# Patient Record
Sex: Male | Born: 1990 | Race: White | Hispanic: No | Marital: Single | Smoking: Never smoker
Health system: Southern US, Community
[De-identification: ages and names within clinical notes are randomized; demographics above are authoritative.]

## PROBLEM LIST (undated history)

## (undated) HISTORY — PX: WISDOM TOOTH EXTRACTION: SHX21

---
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
## 2013-10-18 ENCOUNTER — Ambulatory Visit (INDEPENDENT_AMBULATORY_CARE_PROVIDER_SITE_OTHER): Payer: BC Managed Care – PPO | Admitting: Surgery

## 2013-10-18 ENCOUNTER — Encounter (INDEPENDENT_AMBULATORY_CARE_PROVIDER_SITE_OTHER): Payer: Self-pay | Admitting: Surgery

## 2013-10-18 VITALS — BP 116/78 | HR 80 | Temp 97.0°F | Ht 76.0 in | Wt 259.0 lb

## 2013-10-18 DIAGNOSIS — L989 Disorder of the skin and subcutaneous tissue, unspecified: Secondary | ICD-10-CM

## 2013-10-18 NOTE — Progress Notes (Signed)
   Re:   Karl Martin DOB:   1990/10/10 MRN:   [id number]  ASSESSMENT AND PLAN: 1.  Sebaceous cyst of right medial calf, 2.0 cm, and right upper arm, 1.4 cm  These have never been infected or caused trouble.  I gave him a sheet on sebaceous cyst.  He will call if her wants these removed.  He may time the removal when he is back from school in October.  2.  History of depression 3.  Has only right kidney  Chief Complaint  Patient presents with  . cyst r arm and leg   REFERRING PHYSICIAN: No primary provider on file.  HISTORY OF PRESENT ILLNESS: Karl Martin is a 23 y.o. (DOB: 1990/10/10)  white  male whose primary care physician is No primary provider on file. and comes to me today for mass of right arm and right medial calf. He has noticed these masses for some time. He had never been infected or irritated. He is here for an opinion about their treatment. He is going to [employer] for graduate school in August study biochemistry.   History reviewed. No pertinent past medical history.    Past Surgical History  Procedure Laterality Date  . Wisdom tooth extraction        Current Outpatient Prescriptions  Medication Sig Dispense Refill  . ARIPiprazole (ABILIFY) 2 MG tablet Take 2 mg by mouth daily.      . sertraline (ZOLOFT) 100 MG tablet Take 200 mg by mouth daily.       No current facility-administered medications for this visit.     Not on File  REVIEW OF SYSTEMS: Skin:  Here for 2 skin lesions. Infection:  No history of hepatitis or HIV.  No history of MRSA. Cardiac:  No history of hypertension. No history of heart disease.  No history of prior cardiac catheterization.  No history of seeing a cardiologist. Pulmonary:  Does not smoke cigarettes.  No asthma or bronchitis.  No OSA/CPAP.  Gastrointestinal:  No history of stomach disease.  No history of liver disease.  No history of gall bladder disease.  No history of pancreas disease.  No history of colon  disease. Urologic:  Has just right kidney. Musculoskeletal:  No history of joint or back disease. Hematologic:  No bleeding disorder.  No history of anemia.  Not anticoagulated. Psycho-social:  History of depression.  [facility]  SOCIAL and FAMILY HISTORY: Unmarried. Graduated form [employer]. He is going to [employer] [place] for graduate school in August study biochemistry.  PHYSICAL EXAM: BP 116/78  Pulse 80  Temp(Src) 97 F (36.1 C)  Ht 6\' 4"  (1.93 m)  Wt 259 lb (117.482 kg)  BMI 31.54 kg/m2  General: WN WM who is alert and generally healthy appearing.  HEENT: Normal. Pupils equal. Extremities:  Probable sebaceous cyst of right medial calf, 2.0 cm, and right upper arm, 1.4 cm Psychiatric: Has normal mood and affect.    DATA REVIEWED: No notes  Ovidio Kinavid Neri Vieyra, MD,  [facility], [place] [address]  [address]   [place], [place]    [id number] Phone:  [phone] FAX:  [phone]

## 2018-01-02 ENCOUNTER — Encounter (HOSPITAL_COMMUNITY): Payer: Self-pay | Admitting: Emergency Medicine

## 2018-01-02 ENCOUNTER — Emergency Department (HOSPITAL_COMMUNITY)
Admission: EM | Admit: 2018-01-02 | Discharge: 2018-01-02 | Disposition: A | Discharge status: Home / Self Care | Payer: PPO | Attending: Emergency Medicine | Admitting: Emergency Medicine

## 2018-01-02 DIAGNOSIS — Y929 Unspecified place or not applicable: Secondary | ICD-10-CM | POA: Insufficient documentation

## 2018-01-02 DIAGNOSIS — S0101XA Laceration without foreign body of scalp, initial encounter: Secondary | ICD-10-CM | POA: Diagnosis not present

## 2018-01-02 DIAGNOSIS — Y93B3 Activity, free weights: Secondary | ICD-10-CM | POA: Diagnosis not present

## 2018-01-02 DIAGNOSIS — W208XXA Other cause of strike by thrown, projected or falling object, initial encounter: Secondary | ICD-10-CM | POA: Insufficient documentation

## 2018-01-02 DIAGNOSIS — Y999 Unspecified external cause status: Secondary | ICD-10-CM | POA: Insufficient documentation

## 2018-01-02 DIAGNOSIS — Z79899 Other long term (current) drug therapy: Secondary | ICD-10-CM | POA: Insufficient documentation

## 2018-01-02 MED ORDER — LIDOCAINE-EPINEPHRINE-TETRACAINE (LET) SOLUTION
3.0000 mL | Freq: Once | NASAL | Status: AC
  Administered 2018-01-02: 3 mL via TOPICAL
  Filled 2018-01-02: qty 3

## 2018-01-02 NOTE — ED Provider Notes (Signed)
[facility]-EMERGENCY DEPT Provider Note   CSN: [id number] Arrival date & time: 01/02/18  1621     History   Chief Complaint Chief Complaint  Patient presents with  . Head Injury    HPI Karl Martin is a 27 y.o. male who present to the ED with a laceration to the scalp. Patient reports that about 2 hours prior to arrival to the ED he was working out with free weights and dropped a weight on his head causing a laceration. Patient denies LOC, headache or other problems. Up to date on tetanus.  HPI  History reviewed. No pertinent past medical history.  Patient Active Problem List   Diagnosis Date Noted  . Skin lesion of right arm 10/18/2013  . Skin lesion of right leg 10/18/2013    Past Surgical History:  Procedure Laterality Date  . WISDOM TOOTH EXTRACTION          Home Medications    Prior to Admission medications   Medication Sig Start Date End Date Taking? Authorizing Provider  ARIPiprazole (ABILIFY) 2 MG tablet Take 2 mg by mouth daily.    [provider]  sertraline (ZOLOFT) 100 MG tablet Take 200 mg by mouth daily.    [provider]    Family History No family history on file.  Social History Social History   Tobacco Use  . Smoking status: Never Smoker  . Smokeless tobacco: Never Used  Substance Use Topics  . Alcohol use: Yes  . Drug use: No     Allergies   Patient has no known allergies.   Review of Systems Review of Systems  Skin: Positive for wound.  All other systems reviewed and are negative.    Physical Exam Updated Vital Signs BP (!) 146/87 (BP Location: Left Arm)   Pulse 76   Temp 98.1 F (36.7 C) (Oral)   Resp 20   SpO2 98%   Physical Exam  Constitutional: He is oriented to person, place, and time. He appears well-developed and well-nourished. No distress.  HENT:  Head: Head is with laceration.    Right Ear: Tympanic membrane normal.  Left Ear: Tympanic membrane normal.  Nose:  Nose normal.  Mouth/Throat: Oropharynx is clear and moist.  4 cm laceration to the scalp  Eyes: EOM are normal.  Neck: Neck supple.  Cardiovascular: Normal rate.  Pulmonary/Chest: Effort normal.  Musculoskeletal: Normal range of motion.  Neurological: He is alert and oriented to person, place, and time. He has normal strength and normal reflexes. Gait normal.  Skin: Skin is warm and dry.  Psychiatric: He has a normal mood and affect. His behavior is normal.  Nursing note and vitals reviewed.    ED Treatments / Results  Labs (all labs ordered are listed, but only abnormal results are displayed) Labs Reviewed - No data to display Radiology No results found.  Procedures .Marland Kitchen.Laceration Repair Date/Time: 01/02/2018 7:04 PM Performed by: Janne NapoleonNeese, Hope M, NP Authorized by: Janne NapoleonNeese, Hope M, NP   Consent:    Consent obtained:  Verbal   Consent given by:  Patient   Risks discussed:  Pain and poor cosmetic result Anesthesia (see MAR for exact dosages):    Anesthesia method:  Topical application   Topical anesthetic:  LET Laceration details:    Location:  Scalp   Scalp location:  Frontal   Length (cm):  4 Pre-procedure details:    Preparation:  Patient was prepped and draped in usual sterile fashion Exploration:  Hemostasis achieved with:  LET   Wound exploration: entire depth of wound probed and visualized     Contaminated: no   Treatment:    Area cleansed with:  Saline   Amount of cleaning:  Standard   Irrigation solution:  Sterile saline   Irrigation method:  Syringe Skin repair:    Repair method:  Staples Approximation:    Approximation:  Close Post-procedure details:    Dressing:  Antibiotic ointment   Patient tolerance of procedure:  Tolerated well, no immediate complications   (including critical care time)  Medications Ordered in ED Medications  lidocaine-EPINEPHrine-tetracaine (LET) solution (3 mLs Topical Given 01/02/18 1826)     Initial Impression /  Assessment and Plan / ED Course  I have reviewed the triage vital signs and the nursing notes. 27 y.o. male here with laceration to the scalp stable for d/c with normal neuro exam and no symptoms of concussion. Return precautions discussed with patient and his mother.   Final Clinical Impressions(s) / ED Diagnoses   Final diagnoses:  Laceration of scalp, initial encounter    ED Discharge Orders    None       Kerrie Buffalo [place], [place] 01/02/18 Nicholos Johns    Lorre Nick, MD 01/02/18 [phone]

## 2018-01-02 NOTE — ED Triage Notes (Signed)
Pt reports he dropped a free weight on his head today and has laceration on head. No LOC

## 2018-01-02 NOTE — Discharge Instructions (Addendum)
You will need to follow up to have the staples removed in 5 to 7 days. Follow up sooner for any problems
